# Patient Record
Sex: Female | Born: 1985 | Race: Black or African American | Hispanic: No | Marital: Single | Smoking: Former smoker
Health system: Southern US, Community
[De-identification: ages and names within clinical notes are randomized; demographics above are authoritative.]

## PROBLEM LIST (undated history)

## (undated) DIAGNOSIS — IMO0002 Reserved for concepts with insufficient information to code with codable children: Secondary | ICD-10-CM

## (undated) DIAGNOSIS — A6000 Herpesviral infection of urogenital system, unspecified: Secondary | ICD-10-CM

---
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
## 2003-11-15 ENCOUNTER — Emergency Department (HOSPITAL_COMMUNITY): Admission: EM | Admit: 2003-11-15 | Discharge: 2003-11-15 | Payer: Self-pay | Admitting: Emergency Medicine

## 2004-07-13 ENCOUNTER — Emergency Department (HOSPITAL_COMMUNITY): Admission: EM | Admit: 2004-07-13 | Discharge: 2004-07-13 | Payer: Self-pay | Admitting: Family Medicine

## 2005-02-25 ENCOUNTER — Emergency Department (HOSPITAL_COMMUNITY): Admission: EM | Admit: 2005-02-25 | Discharge: 2005-02-25 | Payer: Self-pay | Admitting: Family Medicine

## 2006-11-12 ENCOUNTER — Other Ambulatory Visit: Admission: RE | Admit: 2006-11-12 | Discharge: 2006-11-12 | Payer: Self-pay | Admitting: Family Medicine

## 2010-07-18 ENCOUNTER — Other Ambulatory Visit: Admission: RE | Admit: 2010-07-18 | Discharge: 2010-07-18 | Payer: Self-pay | Admitting: *Deleted

## 2012-10-29 DIAGNOSIS — R87619 Unspecified abnormal cytological findings in specimens from cervix uteri: Secondary | ICD-10-CM

## 2012-10-29 DIAGNOSIS — IMO0002 Reserved for concepts with insufficient information to code with codable children: Secondary | ICD-10-CM

## 2012-10-29 HISTORY — DX: Unspecified abnormal cytological findings in specimens from cervix uteri: R87.619

## 2012-10-29 HISTORY — DX: Reserved for concepts with insufficient information to code with codable children: IMO0002

## 2012-10-29 NOTE — L&D Delivery Note (Signed)
Delivery Note At 9:28 PM a viable female was delivered via Vaginal, Spontaneous Delivery (Presentation: Left Occiput Anterior).  APGAR: 7, 9; weight pending.   Placenta status: Intact, Spontaneous.  Cord: 3 vessels with the following complications: None.  Cord pH: not collected.  Just before delivery pt's temp was 100.6 axillary while pushing. Will continue to watch. A posterior shoulder dystocia for 30 sec - small median episiotomy was cut with resolution.  Cord double clamped and baby sent to warmer for assessment.  Anesthesia: Epidural  Episiotomy: Median episiotomy Lacerations: 2nd degree;Vaginal;Perineal Suture Repair: 3.0 vicryl and 4.0 vicryl Est. Blood Loss (mL): 300  Mom to postpartum.  Baby to Couplet care / Skin to Skin.  Routine PP orders Circ in office Breastfeeding  Prerana Strayer 10/19/2013, 10:10 PM

## 2013-04-14 LAB — OB RESULTS CONSOLE ANTIBODY SCREEN: Antibody Screen: NEGATIVE

## 2013-04-14 LAB — OB RESULTS CONSOLE HIV ANTIBODY (ROUTINE TESTING): HIV: NONREACTIVE

## 2013-04-14 LAB — OB RESULTS CONSOLE RUBELLA ANTIBODY, IGM: Rubella: IMMUNE

## 2013-04-14 LAB — OB RESULTS CONSOLE ABO/RH: RH Type: NEGATIVE

## 2013-04-14 LAB — OB RESULTS CONSOLE GC/CHLAMYDIA: Gonorrhea: NEGATIVE

## 2013-09-25 ENCOUNTER — Emergency Department (INDEPENDENT_AMBULATORY_CARE_PROVIDER_SITE_OTHER)
Admission: EM | Admit: 2013-09-25 | Discharge: 2013-09-25 | Disposition: A | Discharge status: Home / Self Care | Payer: PRIVATE HEALTH INSURANCE | Source: Home / Self Care

## 2013-09-25 ENCOUNTER — Encounter (HOSPITAL_COMMUNITY): Payer: Self-pay | Admitting: Emergency Medicine

## 2013-09-25 DIAGNOSIS — Z331 Pregnant state, incidental: Secondary | ICD-10-CM

## 2013-09-25 DIAGNOSIS — H109 Unspecified conjunctivitis: Secondary | ICD-10-CM

## 2013-09-25 DIAGNOSIS — R0982 Postnasal drip: Secondary | ICD-10-CM

## 2013-09-25 DIAGNOSIS — Z3403 Encounter for supervision of normal first pregnancy, third trimester: Secondary | ICD-10-CM

## 2013-09-25 DIAGNOSIS — J019 Acute sinusitis, unspecified: Secondary | ICD-10-CM

## 2013-09-25 MED ORDER — AMOXICILLIN 500 MG PO CAPS: 1000.0000 mg | ORAL_CAPSULE | Freq: Two times a day (BID) | ORAL | Status: DC

## 2013-09-25 MED ORDER — ERYTHROMYCIN 5 MG/GM OP OINT: 1.0000 "application " | TOPICAL_OINTMENT | Freq: Four times a day (QID) | OPHTHALMIC | Status: DC

## 2013-09-25 NOTE — ED Provider Notes (Signed)
CSN: [id number]     Arrival date & time 09/25/13  4098 History   First MD Initiated Contact with Patient 09/25/13 [phone]     Chief Complaint  Patient presents with  . URI   (Consider location/radiation/quality/duration/timing/severity/associated sxs/prior Treatment) HPI Comments: 27 year old female who is [redacted] weeks gestation is complaining of a cough, headache, upper respiratory congestion, earache, sore throat and malaise. Denies fever. Has taken Tylenol , TheraFlu and Robitussin.Marland Kitchen   History reviewed. No pertinent past medical history. Past Surgical History  Procedure Laterality Date  . Foot surgery     History reviewed. No pertinent family history. History  Substance Use Topics  . Smoking status: Never Smoker   . Smokeless tobacco: Not on file  . Alcohol Use: No   OB History   Grav Para Term Preterm Abortions TAB SAB Ect Mult Living   1              Review of Systems  Constitutional: Positive for activity change, appetite change and fatigue. Negative for fever and chills.  HENT: Positive for congestion, postnasal drip, rhinorrhea and sore throat. Negative for facial swelling.   Eyes: Positive for redness and itching.  Respiratory: Positive for cough.   Cardiovascular: Negative.   Gastrointestinal: Negative.   Genitourinary: Negative.   Musculoskeletal: Negative.  Negative for neck pain and neck stiffness.  Skin: Negative for pallor and rash.  Neurological: Negative.     Allergies  Review of patient's allergies indicates no known allergies.  Home Medications   Current Outpatient Rx  Name  Route  Sig  Dispense  Refill  . amoxicillin (AMOXIL) 500 MG capsule   Oral   Take 2 capsules (1,000 mg total) by mouth 2 (two) times daily.   28 capsule   0   . erythromycin ophthalmic ointment   Right Eye   Place 1 application into the right eye 4 (four) times daily.   3.5 g   0    Pulse 109  Temp(Src) 99.2 F (37.3 C)  Resp 20  SpO2 100% Physical Exam  Nursing note  and vitals reviewed. Constitutional: She is oriented to person, place, and time. She appears well-developed and well-nourished. No distress.  HENT:  Head: Normocephalic.  Right Ear: External ear normal.  Left Ear: External ear normal.  Mouth/Throat: No oropharyngeal exudate.  Minor erythema of the posterior oropharynx. More and streaky type fashion. No exudates or edema there is thick light brown drainage in the posterior oropharynx..  Eyes: EOM are normal. Pupils are equal, round, and reactive to light.  The patient shows a photograph of her right eye with surrounding erythema, conjunctival erythema. It tends to get better during the day. Exam today reveals minimal conjunctival erythema, no current drainage. She is also wearing makeup.  Neck: Normal range of motion. Neck supple.  Cardiovascular: Normal rate and regular rhythm.   No murmur heard. Pulmonary/Chest: Effort normal. No respiratory distress. She has no wheezes. She has no rales.  Musculoskeletal: Normal range of motion.  Lymphadenopathy:    She has no cervical adenopathy.  Neurological: She is alert and oriented to person, place, and time. No cranial nerve deficit.  Skin: Skin is warm and dry.  Psychiatric: She has a normal mood and affect.    ED Course  Procedures (including critical care time) Labs Review Labs Reviewed  POCT RAPID STREP A ([facility]RG CARE ONLY)   Imaging Review No results found.    MDM   1. Acute rhinosinusitis   2. Conjunctivitis  of right eye   3. PND (post-nasal drip)   4. Pregnancy, first, third trimester      Amoxicillin 500 mg 2 twice a day for 7 days Nasal saline spray frequently 1 teaspoon of Robitussin plain every 4-6 hours when necessary Claritin or Allegra daily when necessary drainage Ery andoint as directedthromycin op  Plenty of fluids Phenylephrine 5 mg every 4-6 hours when necessary congestion Call your physician Monday.  Hayden Rasmussen, NP 09/25/13 1013  Hayden Rasmussen,  NP 09/25/13 1015

## 2013-09-25 NOTE — ED Provider Notes (Signed)
Medical screening examination/treatment/procedure(s) were performed by non-physician practitioner and as supervising physician I was immediately available for consultation/collaboration.  Soo Steelman, M.D.  Aleene Swanner C Saia Derossett, MD 09/25/13 1335 

## 2013-09-25 NOTE — ED Notes (Signed)
C/O productive cough with yellow/green sputum. Left sided headache. Sore throat. Right eye pain and drainage.   Onset 7 days ago.  Pt is currently 35 wks. Pregnant.  Has tried thera  Flu and tylenol with no relief.

## 2013-09-27 LAB — CULTURE, GROUP A STREP

## 2013-10-01 ENCOUNTER — Telehealth (HOSPITAL_COMMUNITY): Payer: Self-pay | Admitting: Emergency Medicine

## 2013-10-01 NOTE — ED Notes (Signed)
Pt. Called and said she dropped the last 2 Amoxicillin in a puddle of water. She asked if she needed to get those refilled. Discussed with Hayden Rasmussen NP and he said no, not necessary. Pt. Informed of this. Vassie Moselle 10/01/2013

## 2013-10-08 ENCOUNTER — Encounter (HOSPITAL_COMMUNITY): Payer: Self-pay

## 2013-10-08 ENCOUNTER — Other Ambulatory Visit: Payer: Self-pay | Admitting: Family Medicine

## 2013-10-08 ENCOUNTER — Inpatient Hospital Stay (HOSPITAL_COMMUNITY): Payer: PRIVATE HEALTH INSURANCE

## 2013-10-08 ENCOUNTER — Inpatient Hospital Stay (HOSPITAL_COMMUNITY)
Admission: AD | Admit: 2013-10-08 | Discharge: 2013-10-08 | Disposition: A | Discharge status: Home / Self Care | Payer: PRIVATE HEALTH INSURANCE | Source: Ambulatory Visit | Attending: Obstetrics and Gynecology | Admitting: Obstetrics and Gynecology

## 2013-10-08 DIAGNOSIS — O368130 Decreased fetal movements, third trimester, not applicable or unspecified: Secondary | ICD-10-CM

## 2013-10-08 DIAGNOSIS — O368190 Decreased fetal movements, unspecified trimester, not applicable or unspecified: Secondary | ICD-10-CM | POA: Insufficient documentation

## 2013-10-08 NOTE — MAU Note (Signed)
Patient states that she was sent from the office for EFM due to non-reasuring NST. She c/o decreased fetal movement today. She states that she is 1.5cm dilated today. She denies pain,. Vaginal bleeding or Lof. SHE ALSO STATES THAT SHE HAVE NO COMPLICATIONS THIS PREGNANCY.

## 2013-10-17 ENCOUNTER — Encounter (HOSPITAL_COMMUNITY): Payer: Self-pay | Admitting: *Deleted

## 2013-10-17 ENCOUNTER — Inpatient Hospital Stay (HOSPITAL_COMMUNITY)
Admission: AD | Admit: 2013-10-17 | Discharge: 2013-10-17 | Disposition: A | Discharge status: Home / Self Care | Payer: PRIVATE HEALTH INSURANCE | Source: Ambulatory Visit | Attending: Obstetrics and Gynecology | Admitting: Obstetrics and Gynecology

## 2013-10-17 DIAGNOSIS — N898 Other specified noninflammatory disorders of vagina: Secondary | ICD-10-CM | POA: Insufficient documentation

## 2013-10-17 DIAGNOSIS — O99891 Other specified diseases and conditions complicating pregnancy: Secondary | ICD-10-CM | POA: Insufficient documentation

## 2013-10-17 LAB — AMNISURE RUPTURE OF MEMBRANE (ROM) NOT AT ARMC: Amnisure ROM: NEGATIVE

## 2013-10-17 NOTE — Progress Notes (Signed)
Pt unaware of ctxs. 

## 2013-10-17 NOTE — Progress Notes (Signed)
Elsie Ra CNM updated as to [facility] results being negative. No new orders and CNM will see pt. Pt aware

## 2013-10-17 NOTE — Progress Notes (Signed)
Pt up to BR. Ok to d/c efm per Elsie Ra CNM

## 2013-10-17 NOTE — MAU Note (Signed)
We were having sex and felt a gush of fld run down leg. Have leaked fld few times since. Saw alittle blood in fld but not much.  No pain

## 2013-10-17 NOTE — MAU Provider Note (Signed)
History  CSN: [id number]  Arrival date and time: 10/17/13 0430   Chief Complaint  Patient presents with  . Rupture of Membranes   HPI  Pt presents unannounced to MAU at 38w 4d with c/o possible SROM.  Reports she had intercourse last evening and then noticed a small "gush" of fluid from her vagina.  Reports no further leakage since that time. Denies painful UCs or bldg.  Active fetus.  Has no other c/o at present.  Denies vag itching, burning or odor. OB History   Grav Para Term Preterm Abortions TAB SAB Ect Mult Living   1               Past Medical History  Diagnosis Date  . HSV-genital     Past Surgical History  Procedure Laterality Date  . Foot surgery      Family History  Problem Relation Age of Onset  . Diabetes Mother   . Cancer Maternal Grandmother   . Heart disease Paternal Grandfather     History  Substance Use Topics  . Smoking status: Never Smoker   . Smokeless tobacco: Not on file  . Alcohol Use: No    Allergies: No Known Allergies  Prescriptions prior to admission  Medication Sig Dispense Refill  . acyclovir (ZOVIRAX) 400 MG tablet Take 400 mg by mouth daily.      Marland Kitchen Phenylephrine-DM-GG-APAP (TYLENOL COLD/FLU SEVERE PO) Take 2 capsules by mouth.      . prenatal vitamin w/FE, FA (PRENATAL 1 + 1) 27-1 MG TABS tablet Take 1 tablet by mouth daily at 12 noon.        Review of Systems  Constitutional: Negative.   HENT: Negative.   Eyes: Negative.   Respiratory: Negative.   Cardiovascular: Negative.   Gastrointestinal: Negative.   Genitourinary: Negative.   Musculoskeletal: Negative.   Skin: Negative.    Physical Exam   Blood pressure 140/74, pulse 112, temperature 98.1 F (36.7 C), resp. rate 20, height 5\' 6"  (1.676 m), weight 85.458 kg (188 lb 6.4 oz).  Physical Exam  Nursing note and vitals reviewed. Constitutional: She is oriented to person, place, and time. She appears well-developed and well-nourished.  HENT:  Head: Normocephalic  and atraumatic.  Right Ear: External ear normal.  Left Ear: External ear normal.  Nose: Nose normal.  Eyes: Conjunctivae are normal. Pupils are equal, round, and reactive to light.  Neck: Normal range of motion. Neck supple.  Cardiovascular: Normal rate, regular rhythm and intact distal pulses.   Respiratory: Effort normal and breath sounds normal.  GI: Soft. Bowel sounds are normal.  Genitourinary: Uterus normal.  Gravid/nontender.  Sm amt white discharge in vault.  SVE: 1cm/90%/-1/vtx.  Musculoskeletal: Normal range of motion.  Neurological: She is alert and oriented to person, place, and time. She has normal reflexes.  Skin: Skin is warm and dry.  Psychiatric: She has a normal mood and affect. Her behavior is normal.   FHR baseline 145bpm.   Variability:  Moderate Accels:  Present Decels:  Absent FHR Cat I Toco: UCs approximately every 7-8 mins and mild to palpation.  Pt talking through UCs. MAU Course  Procedures Results for orders placed during the hospital encounter of 10/17/13 (from the past 48 hour(s))  AMNISURE RUPTURE OF MEMBRANE (ROM)     Status: None   Collection Time    10/17/13  5:03 AM      Result Value Range   Amnisure ROM NEGATIVE      Assessment and Plan  IUP at 38w 5d Vaginal discharge  Pt discharged to home.   Rev [facility] and s/s labor. Pt to f/u as sched at [facility] for ROB.  Garnet Chatmon O. 10/17/2013, 6:51 AM

## 2013-10-17 NOTE — Progress Notes (Signed)
Elsie Ra CNM in to see pt and discuss d/c plan. Written and verbal d/c instructions given and understanding voiced

## 2013-10-17 NOTE — Progress Notes (Signed)
Elsie Ra CNM notified of pt's admission and status. Order for [facility] received and CNM will see pt

## 2013-10-19 ENCOUNTER — Inpatient Hospital Stay (HOSPITAL_COMMUNITY)
Admission: AD | Admit: 2013-10-19 | Discharge: 2013-10-21 | DRG: 774 | Disposition: A | Discharge status: Home / Self Care | Payer: PRIVATE HEALTH INSURANCE | Source: Ambulatory Visit | Attending: Obstetrics and Gynecology | Admitting: Obstetrics and Gynecology

## 2013-10-19 ENCOUNTER — Encounter (HOSPITAL_COMMUNITY): Payer: Self-pay

## 2013-10-19 ENCOUNTER — Encounter (HOSPITAL_COMMUNITY): Payer: PRIVATE HEALTH INSURANCE | Admitting: Anesthesiology

## 2013-10-19 ENCOUNTER — Inpatient Hospital Stay (HOSPITAL_COMMUNITY): Payer: PRIVATE HEALTH INSURANCE | Admitting: Anesthesiology

## 2013-10-19 DIAGNOSIS — O26899 Other specified pregnancy related conditions, unspecified trimester: Secondary | ICD-10-CM | POA: Diagnosis present

## 2013-10-19 DIAGNOSIS — O360990 Maternal care for other rhesus isoimmunization, unspecified trimester, not applicable or unspecified: Secondary | ICD-10-CM | POA: Diagnosis present

## 2013-10-19 DIAGNOSIS — A6000 Herpesviral infection of urogenital system, unspecified: Secondary | ICD-10-CM | POA: Diagnosis present

## 2013-10-19 DIAGNOSIS — B009 Herpesviral infection, unspecified: Secondary | ICD-10-CM | POA: Diagnosis not present

## 2013-10-19 DIAGNOSIS — O4290 Premature rupture of membranes, unspecified as to length of time between rupture and onset of labor, unspecified weeks of gestation: Principal | ICD-10-CM | POA: Diagnosis present

## 2013-10-19 DIAGNOSIS — O99892 Other specified diseases and conditions complicating childbirth: Secondary | ICD-10-CM | POA: Diagnosis present

## 2013-10-19 DIAGNOSIS — Z22330 Carrier of Group B streptococcus: Secondary | ICD-10-CM

## 2013-10-19 DIAGNOSIS — D62 Acute posthemorrhagic anemia: Secondary | ICD-10-CM | POA: Diagnosis not present

## 2013-10-19 DIAGNOSIS — Z6791 Unspecified blood type, Rh negative: Secondary | ICD-10-CM | POA: Diagnosis present

## 2013-10-19 DIAGNOSIS — D649 Anemia, unspecified: Secondary | ICD-10-CM | POA: Diagnosis not present

## 2013-10-19 DIAGNOSIS — O98519 Other viral diseases complicating pregnancy, unspecified trimester: Secondary | ICD-10-CM | POA: Diagnosis present

## 2013-10-19 DIAGNOSIS — O9903 Anemia complicating the puerperium: Secondary | ICD-10-CM | POA: Diagnosis not present

## 2013-10-19 DIAGNOSIS — O99820 Streptococcus B carrier state complicating pregnancy: Secondary | ICD-10-CM

## 2013-10-19 HISTORY — DX: Herpesviral infection of urogenital system, unspecified: A60.00

## 2013-10-19 HISTORY — DX: Reserved for concepts with insufficient information to code with codable children: IMO0002

## 2013-10-19 LAB — CBC
HCT: 32.1 % — ABNORMAL LOW (ref 36.0–46.0)
Hemoglobin: 12.3 g/dL (ref 12.0–15.0)
Hemoglobin: 11.5 g/dL — ABNORMAL LOW (ref 12.0–15.0)
MCH: 29.4 pg (ref 26.0–34.0)
MCH: 29.5 pg (ref 26.0–34.0)
MCHC: 36 g/dL (ref 30.0–36.0)
MCHC: 35.8 g/dL (ref 30.0–36.0)
MCV: 82.3 fL (ref 78.0–100.0)
RDW: 14 % (ref 11.5–15.5)
RDW: 14 % (ref 11.5–15.5)

## 2013-10-19 LAB — RPR: RPR Ser Ql: NONREACTIVE

## 2013-10-19 LAB — OB RESULTS CONSOLE GBS: GBS: POSITIVE

## 2013-10-19 LAB — POCT FERN TEST: POCT Fern Test: NEGATIVE

## 2013-10-19 LAB — AMNISURE RUPTURE OF MEMBRANE (ROM) NOT AT ARMC: Amnisure ROM: POSITIVE

## 2013-10-19 MED ORDER — PHENYLEPHRINE 40 MCG/ML (10ML) SYRINGE FOR IV PUSH (FOR BLOOD PRESSURE SUPPORT)
80.0000 ug | PREFILLED_SYRINGE | INTRAVENOUS | Status: DC | PRN
  Filled 2013-10-19: qty 2

## 2013-10-19 MED ORDER — EPHEDRINE 5 MG/ML INJ
10.0000 mg | INTRAVENOUS | Status: DC | PRN
Start: 2013-10-19 — End: 2013-10-20
  Filled 2013-10-19: qty 4
  Filled 2013-10-19: qty 2

## 2013-10-19 MED ORDER — LACTATED RINGERS IV SOLN
INTRAVENOUS | Status: DC
  Administered 2013-10-19 (×3): via INTRAVENOUS

## 2013-10-19 MED ORDER — LACTATED RINGERS IV SOLN
500.0000 mL | INTRAVENOUS | Status: DC | PRN
  Administered 2013-10-19: 500 mL via INTRAVENOUS

## 2013-10-19 MED ORDER — OXYTOCIN 40 UNITS IN LACTATED RINGERS INFUSION - SIMPLE MED
62.5000 mL/h | INTRAVENOUS | Status: DC
  Administered 2013-10-19: 62.5 mL/h via INTRAVENOUS

## 2013-10-19 MED ORDER — LIDOCAINE HCL (PF) 1 % IJ SOLN
30.0000 mL | INTRAMUSCULAR | Status: DC | PRN
  Filled 2013-10-19 (×2): qty 30

## 2013-10-19 MED ORDER — LACTATED RINGERS IV SOLN
500.0000 mL | Freq: Once | INTRAVENOUS | Status: AC
  Administered 2013-10-19: 500 mL via INTRAVENOUS

## 2013-10-19 MED ORDER — OXYTOCIN BOLUS FROM INFUSION: 500.0000 mL | INTRAVENOUS | Status: DC

## 2013-10-19 MED ORDER — ACETAMINOPHEN 325 MG PO TABS
650.0000 mg | ORAL_TABLET | ORAL | Status: DC | PRN
  Administered 2013-10-19: 650 mg via ORAL
  Filled 2013-10-19: qty 2

## 2013-10-19 MED ORDER — SODIUM BICARBONATE 8.4 % IV SOLN
INTRAVENOUS | Status: DC | PRN
  Administered 2013-10-19: 5 mL via EPIDURAL

## 2013-10-19 MED ORDER — CITRIC ACID-SODIUM CITRATE 334-500 MG/5ML PO SOLN: 30.0000 mL | ORAL | Status: DC | PRN

## 2013-10-19 MED ORDER — OXYCODONE-ACETAMINOPHEN 5-325 MG PO TABS: 1.0000 | ORAL_TABLET | ORAL | Status: DC | PRN

## 2013-10-19 MED ORDER — EPHEDRINE 5 MG/ML INJ
10.0000 mg | INTRAVENOUS | Status: DC | PRN
  Filled 2013-10-19: qty 2

## 2013-10-19 MED ORDER — DIPHENHYDRAMINE HCL 50 MG/ML IJ SOLN: 12.5000 mg | INTRAMUSCULAR | Status: DC | PRN

## 2013-10-19 MED ORDER — DEXTROSE 5 % IV SOLN
5.0000 10*6.[IU] | Freq: Once | INTRAVENOUS | Status: AC
  Administered 2013-10-19: 5 10*6.[IU] via INTRAVENOUS
  Filled 2013-10-19: qty 5

## 2013-10-19 MED ORDER — IBUPROFEN 600 MG PO TABS: 600.0000 mg | ORAL_TABLET | Freq: Four times a day (QID) | ORAL | Status: DC | PRN

## 2013-10-19 MED ORDER — PHENYLEPHRINE 40 MCG/ML (10ML) SYRINGE FOR IV PUSH (FOR BLOOD PRESSURE SUPPORT)
80.0000 ug | PREFILLED_SYRINGE | INTRAVENOUS | Status: DC | PRN
  Filled 2013-10-19: qty 10
  Filled 2013-10-19: qty 2

## 2013-10-19 MED ORDER — PENICILLIN G POTASSIUM 5000000 UNITS IJ SOLR
2.5000 10*6.[IU] | INTRAVENOUS | Status: DC
  Administered 2013-10-19 (×3): 2.5 10*6.[IU] via INTRAVENOUS
  Filled 2013-10-19 (×6): qty 2.5

## 2013-10-19 MED ORDER — TERBUTALINE SULFATE 1 MG/ML IJ SOLN: 0.2500 mg | Freq: Once | INTRAMUSCULAR | Status: AC | PRN

## 2013-10-19 MED ORDER — OXYTOCIN 40 UNITS IN LACTATED RINGERS INFUSION - SIMPLE MED
1.0000 m[IU]/min | INTRAVENOUS | Status: DC
  Administered 2013-10-19: 2 m[IU]/min via INTRAVENOUS
  Administered 2013-10-19: 6 m[IU]/min via INTRAVENOUS
  Administered 2013-10-19: 4 m[IU]/min via INTRAVENOUS
  Filled 2013-10-19: qty 1000

## 2013-10-19 MED ORDER — ONDANSETRON HCL 4 MG/2ML IJ SOLN: 4.0000 mg | Freq: Four times a day (QID) | INTRAMUSCULAR | Status: DC | PRN

## 2013-10-19 MED ORDER — FLEET ENEMA 7-19 GM/118ML RE ENEM: 1.0000 | ENEMA | RECTAL | Status: DC | PRN

## 2013-10-19 MED ORDER — FENTANYL 2.5 MCG/ML BUPIVACAINE 1/10 % EPIDURAL INFUSION (WH - ANES)
14.0000 mL/h | INTRAMUSCULAR | Status: DC | PRN
  Administered 2013-10-19: 14 mL/h via EPIDURAL
  Filled 2013-10-19: qty 125

## 2013-10-19 NOTE — Progress Notes (Signed)
  Subjective: Pt sitting in bed feeling some discomfort.  Objective: BP 114/72  Pulse 91  Temp(Src) 98.4 F (36.9 C) (Oral)  Resp 18  Ht 5\' 6"  (1.676 m)  Wt 189 lb 12.8 oz (86.093 kg)  BMI 30.65 kg/m2  SpO2 100%  LMP 01/20/2013 I/O last 3 completed shifts: In: -  Out: 250 [Urine:250]    FHT:  Cat I UC:   regular, every 2-3 minutes  SVE:   Dilation: 10 Effacement (%): 100 Station: +2 Exam by:: Haroldine Laws, CNM  Assessment / Plan:  Labor: Starting pushing; Pitocin at 10 miliU Preeclampsia: No s/s  Fetal Wellbeing: Cat I Pain Control: Epidural  I/D: GBS pos; ROM at 0304; GBS PCN prophylaxis  Anticipated MOD: SVD   Zamarian Scarano 10/19/2013, 8:13 PM

## 2013-10-19 NOTE — Progress Notes (Signed)
FHR: Cat 1 Comfortable with an epidural Cx: 4 cm by the nurse Continue Pit.  Dr. Stefano Gaul

## 2013-10-19 NOTE — Anesthesia Procedure Notes (Signed)

## 2013-10-19 NOTE — MAU Note (Signed)
Large gush of clear fluid at 0340 this morning. Denies contractions. Positive fetal movement. Denies complications with pregnancy.

## 2013-10-19 NOTE — H&P (Signed)
Amber Gordon is a 27 y.o. female presenting for SROM at [redacted]w[redacted]d. Reports Lg gush of clear fluid at 0340, with continuous leakage since. Feels rare ctx, some recent bloody show. GFM.   Pregnancy course:  Pt began [facility] at 12wks [facility] based on LMP =10/27/13, c/w 12wk [place] 1st trim screen WNL AFP WNL Colposcopy and bx per Dr Normand Sloop at 15wks, normal  Anatomy scan at 18wks WNL 1hr glucola at 26wks normal  rcv'd Rhogam at 29wks rcv'd Tdap and Fluc vaccines at 32wks Began valtrex at 34wks, denied any recent outbreaks GBS neg at 36wks  Maternal Medical History:  Reason for admission: Rupture of membranes.   Contractions: Frequency: rare.   Perceived severity is mild.    Fetal activity: Perceived fetal activity is normal.   Last perceived fetal movement was within the past hour.    Prenatal complications: no prenatal complications Prenatal Complications - Diabetes: none.    OB History   Grav Para Term Preterm Abortions TAB SAB Ect Mult Living   1 0 0 0 0 0 0 0 0 0      Past Medical History  Diagnosis Date  . Genital herpes     last outbreak Sept. 2014  . Abnormal Pap smear 2014    repeat after delivery   Past Surgical History  Procedure Laterality Date  . Bunionectomy Left 2002   Family History: family history includes Cancer in her maternal grandmother; Diabetes in her mother; Heart disease in her paternal grandfather. Social History:  reports that she has quit smoking. She does not have any smokeless tobacco history on file. She reports that she does not drink alcohol or use illicit drugs.   Prenatal Transfer Tool  Maternal Diabetes: No Genetic Screening: Normal Maternal Ultrasounds/Referrals: Normal Fetal Ultrasounds or other Referrals:  None Maternal Substance Abuse:  No Significant Maternal Medications:  Meds include: Other:  valtrex  Significant Maternal Lab Results:  Lab values include: Group B Strep negative Other Comments:  None  Review of Systems  All  other systems reviewed and are negative.      Blood pressure 129/70, pulse 98, temperature 97.9 F (36.6 C), temperature source Oral, resp. rate 18, height 5\' 6"  (1.676 m), weight 189 lb 12.8 oz (86.093 kg), last menstrual period 01/20/2013, SpO2 100.00%. Maternal Exam:  Uterine Assessment: Contraction strength is mild.  Contraction frequency is rare.   Abdomen: Patient reports no abdominal tenderness. Fundal height is aga.   Estimated fetal weight is 7-8#.   Fetal presentation: vertex  Introitus: Normal vulva. Normal vagina.  Ferning test: positive.  Amniotic fluid character: clear.  Pelvis: Vag exam per RN  Cervix: Cervix evaluated by digital exam.     Fetal Exam Fetal Monitor Review: Mode: ultrasound.    Fetal State Assessment: Category I - tracings are normal.     Physical Exam  Nursing note and vitals reviewed. Constitutional: She is oriented to person, place, and time. She appears well-developed and well-nourished.  HENT:  Head: Normocephalic.  Eyes: Pupils are equal, round, and reactive to light.  Neck: Normal range of motion.  Cardiovascular: Normal rate, regular rhythm and normal heart sounds.   Respiratory: Effort normal and breath sounds normal.  GI: Soft. Bowel sounds are normal.  Genitourinary: Vagina normal.  Musculoskeletal: Normal range of motion.  Neurological: She is alert and oriented to person, place, and time. She has normal reflexes.  Skin: Skin is warm and dry.  Psychiatric: She has a normal mood and affect. Her behavior is  normal.    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

## 2013-10-19 NOTE — Progress Notes (Signed)
  Subjective: Pt is comfortable with Epidural.  Objective: BP 111/64  Pulse 91  Temp(Src) 98.4 F (36.9 C) (Oral)  Resp 18  Ht 5\' 6"  (1.676 m)  Wt 189 lb 12.8 oz (86.093 kg)  BMI 30.65 kg/m2  SpO2 100%  LMP 01/20/2013 I/O last 3 completed shifts: In: -  Out: 250 [Urine:250]    FHT:  Cat I UC:   regular, every 2-3 minutes  SVE:   Dilation: 4 Effacement (%): 80 Station: -1 Exam by:: L.Cresenzo, RN/C.Jearld Fenton, RN  Assessment / Plan:  Labor: IOL for PROM; Pitocin at 10 miliU; Preeclampsia: No s/s Fetal Wellbeing: Cat I Pain Control: Epidural I/D: GBS pos; ROM at 0304; GBS PCN prophylaxis  Anticipated MOD: SVD   Tunis Gentle 10/19/2013, 7:43 PM

## 2013-10-19 NOTE — Progress Notes (Signed)
The patient denies any symptoms or signs suggestive of a herpes outbreak.  Speculum exam performed and no lesions are seen.  Herpes outbreaks discussed. We'll allow an attempt at vaginal delivery.  Dr. Stefano Gaul

## 2013-10-19 NOTE — Progress Notes (Signed)
Fetal heart tones: Category 1  The patient is currently on Pitocin.  Continue to observe her labor for now.  Dr. Stefano Gaul

## 2013-10-19 NOTE — Anesthesia Preprocedure Evaluation (Signed)

## 2013-10-20 LAB — CBC
HCT: 27.3 % — ABNORMAL LOW (ref 36.0–46.0)
Hemoglobin: 9.7 g/dL — ABNORMAL LOW (ref 12.0–15.0)
MCH: 29.2 pg (ref 26.0–34.0)
MCV: 82.2 fL (ref 78.0–100.0)
RBC: 3.32 MIL/uL — ABNORMAL LOW (ref 3.87–5.11)
RDW: 14.1 % (ref 11.5–15.5)
WBC: 15.1 10*3/uL — ABNORMAL HIGH (ref 4.0–10.5)

## 2013-10-20 MED ORDER — PRENATAL MULTIVITAMIN CH
1.0000 | ORAL_TABLET | Freq: Every day | ORAL | Status: DC
  Administered 2013-10-20: 1 via ORAL
  Filled 2013-10-20: qty 1

## 2013-10-20 MED ORDER — LANOLIN HYDROUS EX OINT: TOPICAL_OINTMENT | CUTANEOUS | Status: DC | PRN

## 2013-10-20 MED ORDER — SENNOSIDES-DOCUSATE SODIUM 8.6-50 MG PO TABS
2.0000 | ORAL_TABLET | ORAL | Status: DC
Start: 2013-10-20 — End: 2013-10-21
  Administered 2013-10-20 – 2013-10-21 (×2): 2 via ORAL
  Filled 2013-10-20 (×2): qty 2

## 2013-10-20 MED ORDER — ONDANSETRON HCL 4 MG/2ML IJ SOLN: 4.0000 mg | INTRAMUSCULAR | Status: DC | PRN

## 2013-10-20 MED ORDER — RHO D IMMUNE GLOBULIN 1500 UNIT/2ML IJ SOLN
300.0000 ug | Freq: Once | INTRAMUSCULAR | Status: AC
  Administered 2013-10-20: 300 ug via INTRAVENOUS
  Filled 2013-10-20: qty 2

## 2013-10-20 MED ORDER — IBUPROFEN 600 MG PO TABS
600.0000 mg | ORAL_TABLET | Freq: Four times a day (QID) | ORAL | Status: DC
  Administered 2013-10-20 – 2013-10-21 (×6): 600 mg via ORAL
  Filled 2013-10-20 (×6): qty 1

## 2013-10-20 MED ORDER — DIPHENHYDRAMINE HCL 25 MG PO CAPS: 25.0000 mg | ORAL_CAPSULE | Freq: Four times a day (QID) | ORAL | Status: DC | PRN

## 2013-10-20 MED ORDER — TETANUS-DIPHTH-ACELL PERTUSSIS 5-2.5-18.5 LF-MCG/0.5 IM SUSP: 0.5000 mL | Freq: Once | INTRAMUSCULAR | Status: DC

## 2013-10-20 MED ORDER — OXYCODONE-ACETAMINOPHEN 5-325 MG PO TABS: 1.0000 | ORAL_TABLET | ORAL | Status: DC | PRN

## 2013-10-20 MED ORDER — ZOLPIDEM TARTRATE 5 MG PO TABS: 5.0000 mg | ORAL_TABLET | Freq: Every evening | ORAL | Status: DC | PRN

## 2013-10-20 MED ORDER — SIMETHICONE 80 MG PO CHEW: 80.0000 mg | CHEWABLE_TABLET | ORAL | Status: DC | PRN

## 2013-10-20 MED ORDER — WITCH HAZEL-GLYCERIN EX PADS: 1.0000 "application " | MEDICATED_PAD | CUTANEOUS | Status: DC | PRN

## 2013-10-20 MED ORDER — ONDANSETRON HCL 4 MG PO TABS: 4.0000 mg | ORAL_TABLET | ORAL | Status: DC | PRN

## 2013-10-20 MED ORDER — BENZOCAINE-MENTHOL 20-0.5 % EX AERO
1.0000 "application " | INHALATION_SPRAY | CUTANEOUS | Status: DC | PRN
  Administered 2013-10-20: 1 via TOPICAL
  Filled 2013-10-20: qty 56

## 2013-10-20 MED ORDER — DIBUCAINE 1 % RE OINT: 1.0000 "application " | TOPICAL_OINTMENT | RECTAL | Status: DC | PRN

## 2013-10-20 NOTE — Lactation Note (Signed)
This note was copied from the chart of Amber Kimra Kantor. Lactation Consultation Note  Patient Name: Amber Gordon Date: 10/20/2013 Reason for consult: Initial assessment BF basics reviewed with parents. Encouraged STS and que base BF. Lactation brochure left for review. Advised of OP services and support group. Encouraged to call for assist with latch.   Maternal Data Formula Feeding for Exclusion: No Infant to breast within first hour of birth: Yes Has patient been taught Hand Expression?: Yes Does the patient have breastfeeding experience prior to this delivery?: No  Feeding    LATCH Score/Interventions                      Lactation Tools Discussed/Used     Consult Status Consult Status: Follow-up Date: 10/21/13 Follow-up type: In-patient    Alfred Levins 10/20/2013, 1:02 PM

## 2013-10-20 NOTE — Anesthesia Postprocedure Evaluation (Signed)
Anesthesia Post Note  Patient: Amber Gordon  Procedure(s) Performed: * No procedures listed *  Anesthesia type: Epidural  Patient location: Mother/Baby  Post pain: Pain level controlled  Post assessment: Post-op Vital signs reviewed  Last Vitals: BP 121/80  Pulse 92  Temp(Src) 36.6 C (Oral)  Resp 18  Ht 5\' 6"  (1.676 m)  Wt 189 lb 12.8 oz (86.093 kg)  BMI 30.65 kg/m2  SpO2 97%  LMP 01/20/2013  Post vital signs: Reviewed  Level of consciousness: awake  Complications: No apparent anesthesia complications

## 2013-10-21 DIAGNOSIS — D62 Acute posthemorrhagic anemia: Secondary | ICD-10-CM | POA: Diagnosis not present

## 2013-10-21 LAB — RH IG WORKUP (INCLUDES ABO/RH)
ABO/RH(D): A NEG
Fetal Screen: NEGATIVE

## 2013-10-21 MED ORDER — NORETHINDRONE 0.35 MG PO TABS: 1.0000 | ORAL_TABLET | Freq: Every day | ORAL | Status: DC

## 2013-10-21 MED ORDER — NORETHINDRONE 0.35 MG PO TABS: 1.0000 | ORAL_TABLET | Freq: Every day | ORAL | Status: AC

## 2013-10-21 MED ORDER — IBUPROFEN 600 MG PO TABS: 600.0000 mg | ORAL_TABLET | Freq: Four times a day (QID) | ORAL | Status: AC

## 2013-10-21 MED ORDER — FERROUS SULFATE 27 MG PO TABS: 1.0000 | ORAL_TABLET | Freq: Every day | ORAL | Status: AC

## 2013-10-21 NOTE — Lactation Note (Signed)
This note was copied from the chart of Amber Kenzi Bardwell. Lactation Consultation Note  Patient Name: Amber Gordon [id number] Date: 10/21/2013 Reason for consult: Initial assessment Mom reports baby is not latching well to right breast. Assisted Mom with positioning (side-lying) to latch on right breast. Baby demonstrated a good rhythmic suck, no swallows noted, no colostrum with hand expression. Baby nursed off and on for approx 8 minutes, then changed to left breast. No colostrum with hand expression from left breast either, although once baby at breast for few minutes, noted 1-2 swallows. Reviewed cluster feeding with Mom. Advised baby should be at the breast 8-12 times or more in 24 hours. Reviewed adequate void/stools. Advised Mom to refer to Baby N Me booklet, page 24. Monitor voids/stools after d/c. They are adequate up to this point. Since we are not observing any colostrum with hand expression, advised Mom to post pump (she has DEBP at home) at least 4 times/day to encourage milk production and give baby back any amount of EBM she receives. If she does not receive EBM, baby is not satisfied at the breast, or void/stool not adequate, she will need to supplement according to guidelines given. Mom does report breast changes early pregnancy and no history that indicate potential for LMS.  Engorgement care reviewed if needed. Advised of OP services and support group. F/U with Peds on Friday.   Maternal Data Formula Feeding for Exclusion: No  Feeding Feeding Type: Breast Fed Length of feed: 0 min  LATCH Score/Interventions Latch: Grasps breast easily, tongue down, lips flanged, rhythmical sucking. Intervention(s): Adjust position;Assist with latch;Breast massage;Breast compression  Audible Swallowing: A few with stimulation  Type of Nipple: Everted at rest and after stimulation  Comfort (Breast/Nipple): Soft / non-tender     Hold (Positioning): Assistance needed to correctly  position infant at breast and maintain latch. Intervention(s): Breastfeeding basics reviewed;Support Pillows;Position options;Skin to skin  LATCH Score: 8  Lactation Tools Discussed/Used     Consult Status Consult Status: Complete Date: 10/21/13 Follow-up type: In-patient    Alfred Levins 10/21/2013, 10:52 AM

## 2013-10-21 NOTE — Discharge Summary (Signed)
Obstetric Discharge Summary Reason for Admission: onset of labor Prenatal Procedures: none Intrapartum Procedures: spontaneous vaginal delivery Episiotomy with repair Postpartum Procedures: none Complications-Operative and Postpartum: immediate pre delivery temp elevation.   Hemoglobin  Date Value Range Status  10/20/2013 9.7* 12.0 - 15.0 g/dL Final     HCT  Date Value Range Status  10/20/2013 27.3* 36.0 - 46.0 % Final  Delivering Provider: Haroldine Laws CNM Feeding:  Breast  Physical Exam:  General: alert, cooperative and no distress Lochia: appropriate Uterine Fundus: firm Incision: healing well DVT Evaluation: No evidence of DVT seen on physical exam.  Discharge Diagnoses: Term Pregnancy-delivered and anemia  Discharge Information: Date: 10/21/2013 Activity: pelvic rest Diet: iron rich foods Medications: see avs Condition: stable Instructions: refer to practice specific booklet Discharge to: home Contraception:  Micronor    Medication List    STOP taking these medications       TYLENOL COLD/FLU SEVERE PO      TAKE these medications       acyclovir 400 MG tablet  Commonly known as:  ZOVIRAX  Take 400 mg by mouth daily.     Ferrous Sulfate 27 MG Tabs  Take 1 tablet (27 mg total) by mouth daily.     ibuprofen 600 MG tablet  Commonly known as:  ADVIL,MOTRIN  Take 1 tablet (600 mg total) by mouth every 6 (six) hours.     norethindrone 0.35 MG tablet  Commonly known as:  ORTHO MICRONOR  Take 1 tablet (0.35 mg total) by mouth daily.  Start taking on:  11/02/2013     prenatal vitamin w/FE, FA 27-1 MG Tabs tablet  Take 1 tablet by mouth daily at 12 noon.       Follow-up Information   Follow up with [facility] In 6 weeks.   Specialty:  Obstetrics and Gynecology   Contact information:   [address] [place] [address] [phone]      Newborn Data: Live born female  Birth Weight: 8 lb 8.9 oz  (3881 g) APGAR: 7, 9  Home with mother.  Aaro Meyers P 10/21/2013, 12:17 PM

## 2014-08-07 ENCOUNTER — Encounter (HOSPITAL_COMMUNITY): Payer: Self-pay | Admitting: Emergency Medicine

## 2014-08-07 ENCOUNTER — Emergency Department (INDEPENDENT_AMBULATORY_CARE_PROVIDER_SITE_OTHER)
Admission: EM | Admit: 2014-08-07 | Discharge: 2014-08-07 | Disposition: A | Discharge status: Home / Self Care | Payer: No Typology Code available for payment source | Source: Home / Self Care | Attending: Emergency Medicine | Admitting: Emergency Medicine

## 2014-08-07 DIAGNOSIS — B354 Tinea corporis: Secondary | ICD-10-CM

## 2014-08-07 MED ORDER — CLOTRIMAZOLE-BETAMETHASONE 1-0.05 % EX CREA: TOPICAL_CREAM | CUTANEOUS | Status: AC

## 2014-08-07 NOTE — ED Notes (Signed)
C/o rash on neck spreading to chest and arms.  Mild irritation.  Symptoms present x 1 month.  No changes in soaps or detergents. No new medication.

## 2014-08-07 NOTE — Discharge Instructions (Signed)
Body Ringworm Ringworm (tinea corporis) is a fungal infection of the skin on the body. This infection is not caused by worms, but is actually caused by a fungus. Fungus normally lives on the top of your skin and can be useful. However, in the case of ringworms, the fungus grows out of control and causes a skin infection. It can involve any area of skin on the body and can spread easily from one person to another (contagious). Ringworm is a common problem for children, but it can affect adults as well. Ringworm is also often found in athletes, especially wrestlers who share equipment and mats.  CAUSES  Ringworm of the body is caused by a fungus called dermatophyte. It can spread by:  Touchingother people who are infected.  Touchinginfected pets.  Touching or sharingobjects that have been in contact with the infected person or pet (hats, combs, towels, clothing, sports equipment). SYMPTOMS   Itchy, raised red spots and bumps on the skin.  Ring-shaped rash.  Redness near the border of the rash with a clear center.  Dry and scaly skin on or around the rash. Not every person develops a ring-shaped rash. Some develop only the red, scaly patches. DIAGNOSIS  Most often, ringworm can be diagnosed by performing a skin exam. Your caregiver may choose to take a skin scraping from the affected area. The sample will be examined under the microscope to see if the fungus is present.  TREATMENT  Body ringworm may be treated with a topical antifungal cream or ointment. Sometimes, an antifungal shampoo that can be used on your body is prescribed. You may be prescribed antifungal medicines to take by mouth if your ringworm is severe, keeps coming back, or lasts a long time.  HOME CARE INSTRUCTIONS   Only take over-the-counter or prescription medicines as directed by your caregiver.  Wash the infected area and dry it completely before applying yourcream or ointment.  When using antifungal shampoo to  treat the ringworm, leave the shampoo on the body for 3-5 minutes before rinsing.   Wear loose clothing to stop clothes from rubbing and irritating the rash.  Wash or change your bed sheets every night while you have the rash.  Have your pet treated by your veterinarian if it has the same infection. To prevent ringworm:   Practice good hygiene.  Wear sandals or shoes in public places and showers.  Do not share personal items with others.  Avoid touching red patches of skin on other people.  Avoid touching pets that have bald spots or wash your hands after doing so. SEEK MEDICAL CARE IF:   Your rash continues to spread after 7 days of treatment.  Your rash is not gone in 4 weeks.  The area around your rash becomes red, warm, tender, and swollen. Document Released: 10/12/2000 Document Revised: 07/09/2012 Document Reviewed: 04/28/2012 [facility] Patient Information 2015 [place] [place]. This information is not intended to replace advice given to you by your health care provider. Make sure you discuss any questions you have with your health care provider.  Wash your neck and chest with Head and Shoulders Shampoo for the next 2 weeks. Apply the cream twice a day for 2 weeks.

## 2014-08-07 NOTE — ED Provider Notes (Signed)
CSN: [id number]     Arrival date & time 08/07/14  1122 History   First MD Initiated Contact with Patient 08/07/14 1129     Chief Complaint  Patient presents with  . Rash   (Consider location/radiation/quality/duration/timing/severity/associated sxs/prior Treatment) HPI She's a 28 year old warning here for evaluation of rash. She states the rash started on the back of her neck about 5 weeks ago. It is very itchy and dry. In the last week it has started to spread over her shoulders into her chest. She's been using an over-the-counter anti-itch cream with improvement in itchiness, but not the rash itself. 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  Past Medical History  Diagnosis Date  . Genital herpes     last outbreak Sept. 2014  . Abnormal Pap smear 2014    repeat after delivery   Past Surgical History  Procedure Laterality Date  . Bunionectomy Left 2002   Family History  Problem Relation Age of Onset  . Diabetes Mother   . Cancer Maternal Grandmother   . Heart disease Paternal Grandfather    History  Substance Use Topics  . Smoking status: Former Games developermoker  . Smokeless tobacco: Not on file  . Alcohol Use: No   OB History   Grav Para Term Preterm Abortions TAB SAB Ect Mult Living   1 1 1  0 0 0 0 0 0 1     Review of Systems  Constitutional: Negative.   Skin: Positive for rash.    Allergies  Review of patient's allergies indicates no known allergies.  Home Medications   Prior to Admission medications   Medication Sig Start Date End Date Taking? Authorizing Provider  norethindrone (ORTHO MICRONOR) 0.35 MG tablet Take 1 tablet (0.35 mg total) by mouth daily. 11/02/13  Yes Hal MoralesVanessa P Haygood, MD  acyclovir (ZOVIRAX) 400 MG tablet Take 400 mg by mouth daily.    Historical Provider, MD  clotrimazole-betamethasone (LOTRISONE) cream Apply to affected area 2 times daily for 2 weeks. 08/07/14   Charm RingsErin J Maddy Graham, MD  Ferrous Sulfate 27 MG TABS Take 1 tablet (27 mg total) by mouth daily. 10/21/13    Hal MoralesVanessa P Haygood, MD  ibuprofen (ADVIL,MOTRIN) 600 MG tablet Take 1 tablet (600 mg total) by mouth every 6 (six) hours. 10/21/13   Hal MoralesVanessa P Haygood, MD  prenatal vitamin w/FE, FA (PRENATAL 1 + 1) 27-1 MG TABS tablet Take 1 tablet by mouth daily at 12 noon.    Historical Provider, MD   BP 124/81  Pulse 79  Temp(Src) 98.6 F (37 C) (Oral)  Resp 16  SpO2 98%  LMP 07/12/2014  Breastfeeding? No Physical Exam  Constitutional: She appears well-developed and well-nourished. No distress.  Skin:  Erythematous scaly patches on back on neck with some spread to chest    ED Course  Procedures (including critical care time) Labs Review Labs Reviewed - No data to display  Imaging Review No results found.   MDM   1. Tinea corporis    Will treat with clotrimazole/betamethasone cream twice daily for 2 weeks. Recommended washing the area with head and shoulder shampoo for the next 2 weeks as well. Followup as needed.    Charm RingsErin J Jones Viviani, MD 08/07/14 [phone]

## 2014-08-30 ENCOUNTER — Encounter (HOSPITAL_COMMUNITY): Payer: Self-pay | Admitting: Emergency Medicine
